# Patient Record
Sex: Male | Born: 1995 | Race: White | Hispanic: No | Marital: Single | Smoking: Former smoker
Health system: Southern US, Community
[De-identification: ages and names within clinical notes are randomized; demographics above are authoritative.]

## PROBLEM LIST (undated history)

## (undated) DIAGNOSIS — T7840XA Allergy, unspecified, initial encounter: Secondary | ICD-10-CM

## (undated) DIAGNOSIS — F419 Anxiety disorder, unspecified: Secondary | ICD-10-CM

## (undated) DIAGNOSIS — L409 Psoriasis, unspecified: Secondary | ICD-10-CM

## (undated) HISTORY — PX: TYMPANOSTOMY TUBE PLACEMENT: SHX32

## (undated) HISTORY — DX: Allergy, unspecified, initial encounter: T78.40XA

## (undated) HISTORY — PX: TONSILLECTOMY: SUR1361

## (undated) HISTORY — DX: Anxiety disorder, unspecified: F41.9

---
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
## 2016-05-10 DIAGNOSIS — Z0120 Encounter for dental examination and cleaning without abnormal findings: Secondary | ICD-10-CM | POA: Diagnosis not present

## 2016-11-09 DIAGNOSIS — J209 Acute bronchitis, unspecified: Secondary | ICD-10-CM | POA: Diagnosis not present

## 2017-05-09 DIAGNOSIS — G4700 Insomnia, unspecified: Secondary | ICD-10-CM | POA: Diagnosis not present

## 2018-06-10 DIAGNOSIS — B356 Tinea cruris: Secondary | ICD-10-CM | POA: Diagnosis not present

## 2018-06-10 DIAGNOSIS — L309 Dermatitis, unspecified: Secondary | ICD-10-CM | POA: Diagnosis not present

## 2018-07-05 DIAGNOSIS — B356 Tinea cruris: Secondary | ICD-10-CM | POA: Diagnosis not present

## 2018-07-29 DIAGNOSIS — B372 Candidiasis of skin and nail: Secondary | ICD-10-CM | POA: Diagnosis not present

## 2018-08-23 DIAGNOSIS — Z03818 Encounter for observation for suspected exposure to other biological agents ruled out: Secondary | ICD-10-CM | POA: Diagnosis not present

## 2018-09-13 ENCOUNTER — Other Ambulatory Visit: Payer: Self-pay

## 2018-09-13 ENCOUNTER — Ambulatory Visit: Payer: Self-pay | Admitting: *Deleted

## 2018-09-13 DIAGNOSIS — Z20822 Contact with and (suspected) exposure to covid-19: Secondary | ICD-10-CM

## 2018-09-13 NOTE — Telephone Encounter (Signed)
Patient is calling community line- he has just moved back to town an is experiencing symptoms of COVID and has positive exposure with girlfriend. Patient advised of testing site, advised of isolation precautions- which he has been practicing and when to seek care if symptoms should increase or get worse, also advised of when to stop isolation per CDC recommendations. Patient states his symptoms are improving- they were worse last week- needs testing due to living situation with parents. Patient does not have current PCP per patient.  Reason for Disposition . [1] COVID-19 infection suspected by caller or triager AND [2] mild symptoms (cough, fever, or others) AND [1] no complications or SOB  Answer Assessment - Initial Assessment Questions 1. CLOSE CONTACT: "Who is the person with the confirmed or suspected COVID-19 infection that you were exposed to?"     Friend of patient 2. PLACE of CONTACT: "Where were you when you were exposed to COVID-19?" (e.g., home, school, medical waiting room; which city?)     Visiting friend 3. TYPE of CONTACT: "How much contact was there?" (e.g., sitting next to, live in same house, work in same office, same building)     A lot of time spent together- frequent visits 4. DURATION of CONTACT: "How long were you in contact with the COVID-19 patient?" (e.g., a few seconds, passed by person, a few minutes, live with the patient)     hours 5. DATE of CONTACT: "When did you have contact with a COVID-19 patient?" (e.g., how many days ago)     09/12/18 6. TRAVEL: "Have you traveled out of the country recently?" If so, "When and where?"     * Also ask about out-of-state travel, since the CDC has identified some high-risk cities for community spread in the [place].     * Note: Travel becomes less relevant if there is widespread community transmission where the patient lives.     [place] just moved home 7. COMMUNITY SPREAD: "Are there lots of cases of COVID-19 (community spread) where  you live?" (See public health department website, if unsure)       Community spread 8. SYMPTOMS: "Do you have any symptoms?" (e.g., fever, cough, breathing difficulty)     Fever, cough, congestion, fatigue, lose of taste/smell 9. PREGNANCY OR POSTPARTUM: "Is there any chance you are pregnant?" "When was your last menstrual period?" "Did you deliver in the last 2 weeks?"     n/a 10. HIGH RISK: "Do you have any heart or lung problems? Do you have a weak immune system?" (e.g., CHF, COPD, asthma, HIV positive, chemotherapy, renal failure, diabetes mellitus, sickle cell anemia)       no  Protocols used: CORONAVIRUS (COVID-19) DIAGNOSED OR SUSPECTED-A-AH, CORONAVIRUS (COVID-19) EXPOSURE-A-AH

## 2018-09-15 LAB — NOVEL CORONAVIRUS, NAA: SARS-CoV-2, NAA: DETECTED — AB

## 2018-11-04 ENCOUNTER — Ambulatory Visit: Payer: 59 | Admitting: Registered Nurse

## 2018-11-05 ENCOUNTER — Encounter: Payer: Self-pay | Admitting: Registered Nurse

## 2019-05-15 ENCOUNTER — Ambulatory Visit: Payer: Self-pay | Attending: Internal Medicine

## 2019-05-15 DIAGNOSIS — Z23 Encounter for immunization: Secondary | ICD-10-CM

## 2019-05-15 NOTE — Progress Notes (Signed)
   Covid-19 Vaccination Clinic  Name:  Ernest Lloyd    MRN: [id number] DOB: 01/12/1996  05/15/2019  Ernest Lloyd was observed post Covid-19 immunization for 15 minutes without incident. He was provided with Vaccine Information Sheet and instruction to access the V-Safe system.   Ernest Lloyd was instructed to call 911 with any severe reactions post vaccine: Marland Kitchen Difficulty breathing  . Swelling of face and throat  . A fast heartbeat  . A bad rash all over body  . Dizziness and weakness   Immunizations Administered    Name Date Dose VIS Date Route   Pfizer COVID-19 Vaccine 05/15/2019 10:48 AM 0.3 mL 01/24/2019 Intramuscular   Manufacturer: [employer], [employer]   Lot: [id number]   NDC: [address]-8

## 2019-06-11 ENCOUNTER — Ambulatory Visit: Payer: Self-pay | Attending: Internal Medicine

## 2019-06-11 DIAGNOSIS — Z23 Encounter for immunization: Secondary | ICD-10-CM

## 2019-06-11 NOTE — Progress Notes (Signed)
   Covid-19 Vaccination Clinic  Name:  CHRITOPHER COSTER    MRN: [id number] DOB: Sep 27, 1995  06/11/2019  Mr. Snowdon was observed post Covid-19 immunization for 15 minutes without incident. He was provided with Vaccine Information Sheet and instruction to access the V-Safe system.   Mr. Seefeldt was instructed to call 911 with any severe reactions post vaccine: Marland Kitchen Difficulty breathing  . Swelling of face and throat  . A fast heartbeat  . A bad rash all over body  . Dizziness and weakness   Immunizations Administered    Name Date Dose VIS Date Route   Pfizer COVID-19 Vaccine 06/11/2019 12:58 PM 0.3 mL 04/09/2018 Intramuscular   Manufacturer: [employer], [employer]   Lot: [id number]   NDC: [address]-9

## 2021-09-17 ENCOUNTER — Encounter: Payer: Self-pay | Admitting: Emergency Medicine

## 2021-09-17 ENCOUNTER — Ambulatory Visit
Admission: EM | Admit: 2021-09-17 | Discharge: 2021-09-17 | Disposition: A | Discharge status: Home / Self Care | Payer: Managed Care, Other (non HMO) | Attending: Family Medicine | Admitting: Family Medicine

## 2021-09-17 DIAGNOSIS — R300 Dysuria: Secondary | ICD-10-CM | POA: Diagnosis not present

## 2021-09-17 HISTORY — DX: Psoriasis, unspecified: L40.9

## 2021-09-17 LAB — POCT URINALYSIS DIP (MANUAL ENTRY)
Bilirubin, UA: NEGATIVE
Blood, UA: NEGATIVE
Glucose, UA: NEGATIVE mg/dL
Ketones, POC UA: NEGATIVE mg/dL
Leukocytes, UA: NEGATIVE
Nitrite, UA: NEGATIVE
Protein Ur, POC: 30 mg/dL — AB
Spec Grav, UA: >=1.03 — AB (ref 1.010–1.025)
Urobilinogen, UA: 0.2 E.U./dL
pH, UA: 7 (ref 5.0–8.0)

## 2021-09-17 NOTE — Discharge Instructions (Signed)
Make sure you are drinking lots of water  Check MyChart for your test results.  If any results are positive you will be called in an antibiotic

## 2021-09-17 NOTE — ED Provider Notes (Signed)
[facility]    CSN: [id number] Arrival date & time: 09/17/21  1215      History   Chief Complaint Chief Complaint  Patient presents with   Dysuria    HPI Ernest Lloyd is a 26 y.o. male.   HPI Patient states that he is previously healthy.  No problems with bladder or kidneys or urinary tract.  He has never had an STD.  He states he did have STD testing a few months ago that was negative.  He states that he is with the same partner and always uses condoms.  He is here because he has dysuria and saw drop of blood on his penis after he urinated this morning.  Subsequent urination feels normally.  There is no discharge.  No rash.  No new sexual partners  Past Medical History:  Diagnosis Date   Allergy    Psoriasis     There are no problems to display for this patient.   Past Surgical History:  Procedure Laterality Date   TONSILLECTOMY     TYMPANOSTOMY TUBE PLACEMENT     at 26 years old       Home Medications    Prior to Admission medications   Medication Sig Start Date End Date Taking? Authorizing Provider  SKYRIZI PEN 150 MG/ML SOAJ Inject into the skin. 08/15/21   [provider]    Family History Family History  Problem Relation Age of Onset   Healthy Mother    Cancer Father    Kidney Stones Father     Social History Social History   Tobacco Use   Smoking status: Every Day    Types: E-cigarettes  Vaping Use   Vaping Use: Every day   Substances: Nicotine, Flavoring  Substance Use Topics   Alcohol use: Not Currently   Drug use: Yes    Frequency: 20.0 times per week    Types: Marijuana     Allergies   Patient has no known allergies.   Review of Systems Review of Systems See HPI  Physical Exam Triage Vital Signs ED Triage Vitals  Enc Vitals Group     BP 09/17/21 1245 114/76     Pulse Rate 09/17/21 1245 70     Resp 09/17/21 1245 14     Temp 09/17/21 1245 99.1 F (37.3 C)     Temp Source 09/17/21 1245 Oral      SpO2 09/17/21 1245 98 %     Weight 09/17/21 1247 195 lb (88.5 kg)     Height 09/17/21 1247 6' (1.829 m)     Head Circumference --      Peak Flow --      Pain Score 09/17/21 1247 0     Pain Loc --      Pain Edu? --      Excl. in [place]? --    No data found.  Updated Vital Signs BP 114/76 (BP Location: Left Arm)   Pulse 70   Temp 99.1 F (37.3 C) (Oral)   Resp 14   Ht 6' (1.829 m)   Wt 88.5 kg   SpO2 98%   BMI 26.45 kg/m    Physical Exam Constitutional:      General: He is not in acute distress.    Appearance: He is well-developed.  HENT:     Head: Normocephalic and atraumatic.  Eyes:     Conjunctiva/sclera: Conjunctivae normal.     Pupils: Pupils are equal, round, and reactive to light.  Cardiovascular:     Rate and Rhythm: Normal rate.  Pulmonary:     Effort: Pulmonary effort is normal. No respiratory distress.  Abdominal:     General: There is no distension.     Palpations: Abdomen is soft.  Musculoskeletal:        General: Normal range of motion.     Cervical back: Normal range of motion.  Skin:    General: Skin is warm and dry.  Neurological:     Mental Status: He is alert.      UC Treatments / Results  Labs (all labs ordered are listed, but only abnormal results are displayed) Labs Reviewed  POCT URINALYSIS DIP (MANUAL ENTRY) - Abnormal; Notable for the following components:      Result Value   Clarity, UA cloudy (*)    Spec Grav, UA >=1.030 (*)    Protein Ur, POC =30 (*)    All other components within normal limits  CYTOLOGY, (ORAL, ANAL, URETHRAL) ANCILLARY ONLY    EKG   Radiology No results found.  Procedures Procedures (including critical care time)  Medications Ordered in UC Medications - No data to display  Initial Impression / Assessment and Plan / UC Course  I have reviewed the triage vital signs and the nursing notes.  Pertinent labs & imaging results that were available during my care of the patient were reviewed by me and  considered in my medical decision making (see chart for details).     Discussed male UTI.  Cystitis, urethritis, prostatitis and kidney infection.  His urinalysis is absolutely normal.  We will do a penile swab.  Urine culture.  Treat based on findings once results are available Final Clinical Impressions(s) / UC Diagnoses   Final diagnoses:  Dysuria     Discharge Instructions      Make sure you are drinking lots of water  Check MyChart for your test results.  If any results are positive you will be called in an antibiotic   ED Prescriptions   None    PDMP not reviewed this encounter.   Eustace Moore, MD 09/17/21 [phone]

## 2021-09-17 NOTE — ED Triage Notes (Signed)
Woke up this am w/ burning with urination  Pt also noted blood on the tip of his penis after urinating Pt was tested for STD a few months ago  Currently with the same  male partner

## 2021-09-19 LAB — CYTOLOGY, (ORAL, ANAL, URETHRAL) ANCILLARY ONLY
Chlamydia: NEGATIVE
Comment: NEGATIVE
Comment: NEGATIVE
Comment: NORMAL
Neisseria Gonorrhea: NEGATIVE
Trichomonas: NEGATIVE

## 2021-09-30 ENCOUNTER — Telehealth: Payer: Self-pay | Admitting: Emergency Medicine

## 2021-09-30 NOTE — Telephone Encounter (Signed)
Patient called in wanting clarity on his mychart results due to him starting to have repeat symptoms.  Patient is now having more of blood in his semen when he ejaculates.  Advised patient that he should contact [facility] for an appointment for further evaluation of his sx's.  Patient voices understanding.

## 2021-11-04 ENCOUNTER — Encounter: Payer: Self-pay | Admitting: Emergency Medicine

## 2021-11-04 ENCOUNTER — Ambulatory Visit
Admission: EM | Admit: 2021-11-04 | Discharge: 2021-11-04 | Disposition: A | Discharge status: Home / Self Care | Payer: Managed Care, Other (non HMO) | Attending: Urgent Care | Admitting: Urgent Care

## 2021-11-04 DIAGNOSIS — R0981 Nasal congestion: Secondary | ICD-10-CM | POA: Diagnosis not present

## 2021-11-04 DIAGNOSIS — Z20822 Contact with and (suspected) exposure to covid-19: Secondary | ICD-10-CM | POA: Diagnosis not present

## 2021-11-04 LAB — RESP PANEL BY RT-PCR (FLU A&B, COVID) ARPGX2
Influenza A by PCR: NEGATIVE
Influenza B by PCR: NEGATIVE
SARS Coronavirus 2 by RT PCR: NEGATIVE

## 2021-11-04 MED ORDER — OXYMETAZOLINE HCL 0.05 % NA SOLN: 1.0000 | Freq: Two times a day (BID) | NASAL | 0 refills | Status: DC

## 2021-11-04 MED ORDER — PSEUDOEPHEDRINE HCL 30 MG PO TABS: 30.0000 mg | ORAL_TABLET | Freq: Four times a day (QID) | ORAL | 0 refills | Status: DC | PRN

## 2021-11-04 NOTE — ED Provider Notes (Signed)
[facility]    CSN: [id number] Arrival date & time: 11/04/21  1327      History   Chief Complaint Chief Complaint  Patient presents with  . Nasal Congestion    HPI Ernest Lloyd is a 26 y.o. male.   HPI  Past Medical History:  Diagnosis Date  . Allergy   . Psoriasis     There are no problems to display for this patient.   Past Surgical History:  Procedure Laterality Date  . TONSILLECTOMY    . TYMPANOSTOMY TUBE PLACEMENT     at 26 years old       Home Medications    Prior to Admission medications   Medication Sig Start Date End Date Taking? Authorizing Provider  SKYRIZI PEN 150 MG/ML SOAJ Inject into the skin. 08/15/21   [provider]    Family History Family History  Problem Relation Age of Onset  . Healthy Mother   . Cancer Father   . Kidney Stones Father     Social History Social History   Tobacco Use  . Smoking status: Former    Types: Cigarettes  Vaping Use  . Vaping Use: Former  . Substances: Nicotine, Flavoring  Substance Use Topics  . Alcohol use: Not Currently  . Drug use: Yes    Frequency: 20.0 times per week    Types: Marijuana     Allergies   Patient has no known allergies.   Review of Systems Review of Systems   Physical Exam Triage Vital Signs ED Triage Vitals  Enc Vitals Group     BP 11/04/21 1420 125/78     Pulse Rate 11/04/21 1420 88     Resp 11/04/21 1420 18     Temp 11/04/21 1420 98.3 F (36.8 C)     Temp Source 11/04/21 1420 Oral     SpO2 11/04/21 1420 98 %     Weight --      Height --      Head Circumference --      Peak Flow --      Pain Score 11/04/21 1421 0     Pain Loc --      Pain Edu? --      Excl. in [place]? --    No data found.  Updated Vital Signs BP 125/78 (BP Location: Right Arm)   Pulse 88   Temp 98.3 F (36.8 C) (Oral)   Resp 18   SpO2 98%   Visual Acuity Right Eye Distance:   Left Eye Distance:   Bilateral Distance:    Right Eye Near:   Left Eye  Near:    Bilateral Near:     Physical Exam   UC Treatments / Results  Labs (all labs ordered are listed, but only abnormal results are displayed) Labs Reviewed  RESP PANEL BY RT-PCR (FLU A&B, COVID) [id number]    EKG   Radiology No results found.  Procedures Procedures (including critical care time)  Medications Ordered in UC Medications - No data to display  Initial Impression / Assessment and Plan / UC Course  I have reviewed the triage vital signs and the nursing notes.  Pertinent labs & imaging results that were available during my care of the patient were reviewed by me and considered in my medical decision making (see chart for details).     *** Final Clinical Impressions(s) / UC Diagnoses   Final diagnoses:  None   Discharge Instructions   None  ED Prescriptions   None    PDMP not reviewed this encounter.

## 2021-11-04 NOTE — ED Triage Notes (Addendum)
Pt c/o nasal congestion and sinus pressure since Wednesday. States he has not tested for covid. He is taking OTC cold meds. He would like PCR covid test

## 2021-11-04 NOTE — Discharge Instructions (Signed)
Your covid test will result in the next 24 hours. Please stay hydrated with water. Start taking afrin nasal spray, but do not exceed 3 days of use.. You may take the decongestant called in today.  Please avoid any additional over-the-counter medications. If your COVID test is positive, we will start you on Paxlovid.

## 2021-11-05 ENCOUNTER — Telehealth: Payer: Self-pay

## 2021-11-05 NOTE — Telephone Encounter (Unsigned)
TC to f/u with pt after yesterday's visit to [facility]. Pt reports no problems or questions at this time.

## 2022-12-15 DIAGNOSIS — L408 Other psoriasis: Secondary | ICD-10-CM | POA: Diagnosis not present

## 2022-12-15 DIAGNOSIS — L400 Psoriasis vulgaris: Secondary | ICD-10-CM | POA: Diagnosis not present

## 2022-12-21 DIAGNOSIS — L400 Psoriasis vulgaris: Secondary | ICD-10-CM | POA: Insufficient documentation

## 2023-01-17 DIAGNOSIS — F411 Generalized anxiety disorder: Secondary | ICD-10-CM | POA: Diagnosis not present

## 2023-01-29 DIAGNOSIS — F320 Major depressive disorder, single episode, mild: Secondary | ICD-10-CM | POA: Diagnosis not present

## 2023-03-01 DIAGNOSIS — F320 Major depressive disorder, single episode, mild: Secondary | ICD-10-CM | POA: Diagnosis not present

## 2023-03-29 DIAGNOSIS — F320 Major depressive disorder, single episode, mild: Secondary | ICD-10-CM | POA: Diagnosis not present

## 2023-04-26 DIAGNOSIS — F320 Major depressive disorder, single episode, mild: Secondary | ICD-10-CM | POA: Diagnosis not present

## 2023-05-08 DIAGNOSIS — F320 Major depressive disorder, single episode, mild: Secondary | ICD-10-CM | POA: Diagnosis not present

## 2023-05-22 DIAGNOSIS — F320 Major depressive disorder, single episode, mild: Secondary | ICD-10-CM | POA: Diagnosis not present

## 2023-06-14 DIAGNOSIS — L400 Psoriasis vulgaris: Secondary | ICD-10-CM | POA: Diagnosis not present

## 2023-06-14 DIAGNOSIS — L728 Other follicular cysts of the skin and subcutaneous tissue: Secondary | ICD-10-CM | POA: Diagnosis not present

## 2023-06-14 DIAGNOSIS — D225 Melanocytic nevi of trunk: Secondary | ICD-10-CM | POA: Diagnosis not present

## 2023-06-15 DIAGNOSIS — F320 Major depressive disorder, single episode, mild: Secondary | ICD-10-CM | POA: Diagnosis not present

## 2023-07-03 DIAGNOSIS — F4320 Adjustment disorder, unspecified: Secondary | ICD-10-CM | POA: Diagnosis not present

## 2023-07-05 ENCOUNTER — Other Ambulatory Visit (HOSPITAL_COMMUNITY)
Admission: RE | Admit: 2023-07-05 | Discharge: 2023-07-05 | Disposition: A | Discharge status: Home / Self Care | Source: Ambulatory Visit | Attending: Physician Assistant | Admitting: Physician Assistant

## 2023-07-05 ENCOUNTER — Encounter: Payer: Self-pay | Admitting: Physician Assistant

## 2023-07-05 ENCOUNTER — Ambulatory Visit: Admitting: Physician Assistant

## 2023-07-05 VITALS — BP 116/69 | HR 64 | Ht 71.75 in | Wt 206.4 lb

## 2023-07-05 DIAGNOSIS — Z0000 Encounter for general adult medical examination without abnormal findings: Secondary | ICD-10-CM

## 2023-07-05 DIAGNOSIS — Z13220 Encounter for screening for lipoid disorders: Secondary | ICD-10-CM | POA: Diagnosis not present

## 2023-07-05 DIAGNOSIS — Z113 Encounter for screening for infections with a predominantly sexual mode of transmission: Secondary | ICD-10-CM

## 2023-07-05 DIAGNOSIS — F418 Other specified anxiety disorders: Secondary | ICD-10-CM | POA: Diagnosis not present

## 2023-07-05 LAB — CBC WITH DIFFERENTIAL/PLATELET
Basophils Absolute: 0.1 10*3/uL (ref 0.0–0.1)
Basophils Relative: 1.8 % (ref 0.0–3.0)
Eosinophils Absolute: 0.1 10*3/uL (ref 0.0–0.7)
Eosinophils Relative: 2.9 % (ref 0.0–5.0)
HCT: 44.9 % (ref 39.0–52.0)
Hemoglobin: 15.8 g/dL (ref 13.0–17.0)
Lymphocytes Relative: 33.4 % (ref 12.0–46.0)
Lymphs Abs: 1.5 10*3/uL (ref 0.7–4.0)
MCHC: 35.3 g/dL (ref 30.0–36.0)
MCV: 84.9 fl (ref 78.0–100.0)
Monocytes Absolute: 0.3 10*3/uL (ref 0.1–1.0)
Monocytes Relative: 6.4 % (ref 3.0–12.0)
Neutro Abs: 2.4 10*3/uL (ref 1.4–7.7)
Neutrophils Relative %: 55.5 % (ref 43.0–77.0)
Platelets: 255 10*3/uL (ref 150.0–400.0)
RBC: 5.29 Mil/uL (ref 4.22–5.81)
RDW: 13.1 % (ref 11.5–15.5)
WBC: 4.4 10*3/uL (ref 4.0–10.5)

## 2023-07-05 LAB — COMPREHENSIVE METABOLIC PANEL WITH GFR
ALT: 12 U/L (ref 0–53)
AST: 12 U/L (ref 0–37)
Albumin: 4.9 g/dL (ref 3.5–5.2)
Alkaline Phosphatase: 62 U/L (ref 39–117)
BUN: 19 mg/dL (ref 6–23)
CO2: 27 meq/L (ref 19–32)
Calcium: 9.7 mg/dL (ref 8.4–10.5)
Chloride: 106 meq/L (ref 96–112)
Creatinine, Ser: 1.05 mg/dL (ref 0.40–1.50)
GFR: 97.38 mL/min (ref >60.00)
Glucose, Bld: 87 mg/dL (ref 70–99)
Potassium: 4.3 meq/L (ref 3.5–5.1)
Sodium: 139 meq/L (ref 135–145)
Total Bilirubin: 0.8 mg/dL (ref 0.2–1.2)
Total Protein: 7.4 g/dL (ref 6.0–8.3)

## 2023-07-05 LAB — LIPID PANEL
Cholesterol: 198 mg/dL (ref 0–200)
HDL: 54.5 mg/dL (ref >39.00)
LDL Cholesterol: 125 mg/dL — ABNORMAL HIGH (ref 0–99)
NonHDL: 143.56
Total CHOL/HDL Ratio: 4
Triglycerides: 91 mg/dL (ref 0.0–149.0)
VLDL: 18.2 mg/dL (ref 0.0–40.0)

## 2023-07-05 MED ORDER — LORAZEPAM 0.5 MG PO TABS: 0.2500 mg | ORAL_TABLET | Freq: Two times a day (BID) | ORAL | 0 refills | Status: AC | PRN

## 2023-07-05 NOTE — Progress Notes (Signed)
 New patient visit   Patient: Ernest Lloyd   DOB: 08-12-1995   28 y.o. Male  MRN: [id number] Visit Date: 07/05/2023  Today's healthcare provider: Trenton Frock, PA-C   Cc. New pt, cpe, anxiety  Subjective    Ernest Lloyd is a 28 y.o. male who presents today as a new patient to establish care.   Discussed the use of AI scribe software for clinical note transcription with the patient, who gave verbal consent to proceed.  History of Present Illness   Ernest Lloyd is a 28 year old male who presents for a routine check-up and to establish primary care.  He has not had a routine check-up in several years and seeks to establish care with a primary care provider. He moved to [facility] for school and has only sought medical attention for specific issues, such as psoriasis. He has undergone blood work related to psoriasis but not a general health check-up in several years.  He manages anxiety through therapy and is considering medication for an upcoming trip due to a fear of flying. He has not taken prnanxiety medication before but used sertraline for depression four to five years ago.  He smoked cigarettes briefly and vaped for a few years, quitting a year ago. He currently smokes marijuana every other day, which affects his anxiety.  His father had prostate cancer in his forties, treated with prostate removal, and is currently clear. His mother was recently diagnosed with heart failure.        Past Medical History:  Diagnosis Date   Allergy    Anxiety 05/15/2023   Anxiety is somewhat constant. Has spiked in the last month.   Psoriasis    Past Surgical History:  Procedure Laterality Date   TONSILLECTOMY     TYMPANOSTOMY TUBE PLACEMENT     at 28 years old   Family Status  Relation Name Status   Mother Tysen Roesler Alive   Father Semaj Coburn Alive  No partnership data on file   Family History  Problem Relation Age of Onset   Healthy Mother    Heart disease Mother     Cancer Father        prostate   Kidney Stones Father    Social History   Socioeconomic History   Marital status: Single    Spouse name: Not on file   Number of children: Not on file   Years of education: Not on file   Highest education level: Some college, no degree  Occupational History   Not on file  Tobacco Use   Smoking status: Former    Types: Cigarettes   Smokeless tobacco: Not on file  Vaping Use   Vaping status: Former   Substances: Nicotine, Flavoring  Substance and Sexual Activity   Alcohol use: Yes    Alcohol/week: 1.0 standard drink of alcohol    Types: 1 Cans of beer per week    Comment: I drink once or twice a month   Drug use: Yes    Frequency: 5.0 times per week    Types: Marijuana    Comment: every other day   Sexual activity: Yes    Birth control/protection: Condom    Comment: partner is on BCP  Other Topics Concern   Not on file  Social History Narrative   Not on file   Social Drivers of Health   Financial Resource Strain: Low Risk  (07/04/2023)   Overall Financial Resource Strain (CARDIA)  Difficulty of Paying Living Expenses: Not hard at all  Food Insecurity: No Food Insecurity (07/04/2023)   Hunger Vital Sign    Worried About Running Out of Food in the Last Year: Never true    Ran Out of Food in the Last Year: Never true  Transportation Needs: No Transportation Needs (07/04/2023)   PRAPARE - Administrator, Civil Service (Medical): No    Lack of Transportation (Non-Medical): No  Physical Activity: Sufficiently Active (07/04/2023)   Exercise Vital Sign    Days of Exercise per Week: 5 days    Minutes of Exercise per Session: 80 min  Stress: Stress Concern Present (07/04/2023)   [employer] of Occupational Health - Occupational Stress Questionnaire    Feeling of Stress : To some extent  Social Connections: Moderately Isolated (07/04/2023)   Social Connection and Isolation Panel [NHANES]    Frequency of Communication with  Friends and Family: More than three times a week    Frequency of Social Gatherings with Friends and Family: More than three times a week    Attends Religious Services: 1 to 4 times per year    Active Member of [employer] or Organizations: No    Attends Engineer, structural: Not on file    Marital Status: Never married   Outpatient Medications Prior to Visit  Medication Sig   SKYRIZI PEN 150 MG/ML SOAJ Inject into the skin.   [DISCONTINUED] oxymetazoline  (AFRIN NASAL SPRAY) 0.05 % nasal spray Place 1 spray into both nostrils 2 (two) times daily. DO NOT USE >3 DAYS   [DISCONTINUED] pseudoephedrine  (SUDAFED) 30 MG tablet Take 1 tablet (30 mg total) by mouth every 6 (six) hours as needed for congestion.   No facility-administered medications prior to visit.   No Known Allergies  Immunization History  Administered Date(s) Administered   Dtap, Unspecified 04/25/1996, 06/26/1996, 09/15/1996, 06/22/1997, 05/04/2000   HIB, Unspecified 04/25/1996, 06/26/1996, 09/15/1996, 06/22/1997   Hep A, Unspecified 03/12/2007   Hep B, Unspecified 06/25/1996, 03/28/1996, 11/25/1996   MMR 03/30/1997, 05/04/2000   PFIZER(Purple Top)SARS-COV-2 Vaccination 05/15/2019, 06/11/2019   Polio, Unspecified 04/25/1996, 06/26/1996, 03/30/1997, 05/04/2000   Tdap 02/14/2007, 03/12/2007   Varicella 03/30/1997    Health Maintenance  Topic Date Due   HIV Screening  Never done   Hepatitis C Screening  Never done   DTaP/Tdap/Td (8 - Td or Tdap) 03/11/2017   COVID-19 Vaccine (3 - 2024-25 season) 10/15/2022   INFLUENZA VACCINE  09/14/2023   HPV VACCINES  Aged Out   Meningococcal B Vaccine  Aged Out    Patient Care Team: Trenton Frock, PA-C as PCP - General (Physician Assistant)  Review of Systems  Constitutional:  Negative for fatigue and fever.  Respiratory:  Negative for cough and shortness of breath.   Cardiovascular:  Negative for chest pain, palpitations and leg swelling.  Genitourinary:  Positive for  frequency.  Neurological:  Negative for dizziness and headaches.        Objective    BP 116/69   Pulse 64   Ht 5' 11.75" (1.822 m)   Wt 206 lb 6.4 oz (93.6 kg)   BMI 28.19 kg/m     Physical Exam Constitutional:      General: He is awake.     Appearance: He is well-developed.  HENT:     Head: Normocephalic.     Right Ear: Tympanic membrane, ear canal and external ear normal.     Left Ear: Tympanic membrane, ear canal and external ear normal.  Nose: Nose normal. No congestion or rhinorrhea.     Mouth/Throat:     Mouth: Mucous membranes are moist.     Pharynx: No oropharyngeal exudate or posterior oropharyngeal erythema.  Eyes:     Pupils: Pupils are equal, round, and reactive to light.  Cardiovascular:     Rate and Rhythm: Normal rate and regular rhythm.     Heart sounds: Normal heart sounds.  Pulmonary:     Effort: Pulmonary effort is normal.     Breath sounds: Normal breath sounds.  Abdominal:     General: There is no distension.     Palpations: Abdomen is soft.     Tenderness: There is no abdominal tenderness. There is no guarding.  Musculoskeletal:     Cervical back: Normal range of motion.     Right lower leg: No edema.     Left lower leg: No edema.  Lymphadenopathy:     Cervical: No cervical adenopathy.  Skin:    General: Skin is warm.  Neurological:     Mental Status: He is alert and oriented to person, place, and time.  Psychiatric:        Attention and Perception: Attention normal.        Mood and Affect: Mood normal.        Speech: Speech normal.        Behavior: Behavior normal. Behavior is cooperative.    Depression Screen    07/05/2023    8:31 AM  PHQ 2/9 Scores  PHQ - 2 Score 2   No results found for any visits on 07/05/23.  Assessment & Plan     Annual physical exam -     CBC with Differential/Platelet -     Comprehensive metabolic panel with GFR -     Lipid panel  Situational anxiety Assessment & Plan: - Prescribe lorazepam  0.5 mg for travel-related anxiety. Instruct to test a half dose prior to travel to assess tolerance. - Advise against driving, alcohol, or marijuana use while taking lorazepam. - Recommend checking regulations for bringing lorazepam to [place].  Orders: -     LORazepam; Take 0.5-1 tablets (0.25-0.5 mg total) by mouth 2 (two) times daily as needed for anxiety.  Dispense: 10 tablet; Refill: 0  Routine screening for STI (sexually transmitted infection) -     RPR -     Hepatitis C antibody -     HIV Antibody (routine testing w rflx) -     Urine cytology ancillary only   General Health Maintenance Due for routine health maintenance and screenings. Uncertain about last tetanus vaccination, from my records was due in 2019. Family history of prostate cancer and heart failure noted. Discussed potential need for earlier prostate cancer screening due to family history, especially since father's diagnosis was in his forties. Suggested discussing genetic testing with father to assess risk markers.    Return in about 1 year (around 07/04/2024), or if symptoms worsen or fail to improve, for CPE.      Trenton Frock, PA-C  [facility] [phone] (phone) [phone] (fax)  [facility]

## 2023-07-05 NOTE — Assessment & Plan Note (Signed)
-   Prescribe lorazepam 0.5 mg for travel-related anxiety. Instruct to test a half dose prior to travel to assess tolerance. - Advise against driving, alcohol, or marijuana use while taking lorazepam. - Recommend checking regulations for bringing lorazepam to [place].

## 2023-07-06 LAB — URINE CYTOLOGY ANCILLARY ONLY
Chlamydia: NEGATIVE
Comment: NEGATIVE
Comment: NEGATIVE
Comment: NORMAL
Neisseria Gonorrhea: NEGATIVE
Trichomonas: NEGATIVE

## 2023-07-06 LAB — RPR: RPR Ser Ql: NONREACTIVE

## 2023-07-06 LAB — HEPATITIS C ANTIBODY: Hepatitis C Ab: NONREACTIVE

## 2023-07-06 LAB — HIV ANTIBODY (ROUTINE TESTING W REFLEX): HIV 1&2 Ab, 4th Generation: NONREACTIVE

## 2023-07-10 ENCOUNTER — Ambulatory Visit: Payer: Self-pay | Admitting: Physician Assistant

## 2023-07-26 DIAGNOSIS — F411 Generalized anxiety disorder: Secondary | ICD-10-CM | POA: Diagnosis not present

## 2023-08-07 ENCOUNTER — Encounter: Payer: Self-pay | Admitting: Physician Assistant

## 2023-08-07 DIAGNOSIS — F418 Other specified anxiety disorders: Secondary | ICD-10-CM

## 2023-08-08 NOTE — Telephone Encounter (Signed)
 Requesting: lorazepam  0.5mg   Contract: None UDS: None Last Visit: 07/05/23 Next Visit: 07/04/24 Last Refill: 07/05/23 #10 and 0RF   Please Advise

## 2023-08-23 DIAGNOSIS — F411 Generalized anxiety disorder: Secondary | ICD-10-CM | POA: Diagnosis not present

## 2023-09-11 DIAGNOSIS — F320 Major depressive disorder, single episode, mild: Secondary | ICD-10-CM | POA: Diagnosis not present

## 2023-10-19 ENCOUNTER — Encounter: Payer: Self-pay | Admitting: Physician Assistant

## 2023-10-19 DIAGNOSIS — F320 Major depressive disorder, single episode, mild: Secondary | ICD-10-CM | POA: Diagnosis not present

## 2023-11-08 DIAGNOSIS — F411 Generalized anxiety disorder: Secondary | ICD-10-CM | POA: Diagnosis not present

## 2023-11-22 DIAGNOSIS — F411 Generalized anxiety disorder: Secondary | ICD-10-CM | POA: Diagnosis not present

## 2023-12-07 DIAGNOSIS — F411 Generalized anxiety disorder: Secondary | ICD-10-CM | POA: Diagnosis not present

## 2023-12-13 DIAGNOSIS — F411 Generalized anxiety disorder: Secondary | ICD-10-CM | POA: Diagnosis not present

## 2023-12-27 DIAGNOSIS — F320 Major depressive disorder, single episode, mild: Secondary | ICD-10-CM | POA: Diagnosis not present

## 2024-01-02 DIAGNOSIS — L408 Other psoriasis: Secondary | ICD-10-CM | POA: Diagnosis not present

## 2024-01-02 DIAGNOSIS — L400 Psoriasis vulgaris: Secondary | ICD-10-CM | POA: Diagnosis not present

## 2024-01-02 DIAGNOSIS — L814 Other melanin hyperpigmentation: Secondary | ICD-10-CM | POA: Diagnosis not present

## 2024-01-02 DIAGNOSIS — L728 Other follicular cysts of the skin and subcutaneous tissue: Secondary | ICD-10-CM | POA: Diagnosis not present

## 2024-01-22 DIAGNOSIS — F411 Generalized anxiety disorder: Secondary | ICD-10-CM | POA: Diagnosis not present

## 2024-07-04 ENCOUNTER — Encounter: Admitting: Physician Assistant
